# Patient Record
Sex: Male | Born: 2000 | Marital: Single
Health system: Southern US, Community
[De-identification: ages and names within clinical notes are randomized; demographics above are authoritative.]

---
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
## 2021-03-09 IMAGING — CR DG ABDOMEN 2V
3 series · 3 of 3 positions shown · non-contrast
Comparison: None.

CLINICAL DATA: 18-year-old male with cough and right-sided
abdominal pain.

EXAM:
ABDOMEN - 2 VIEW

[abdomen supine (1 of 2)]
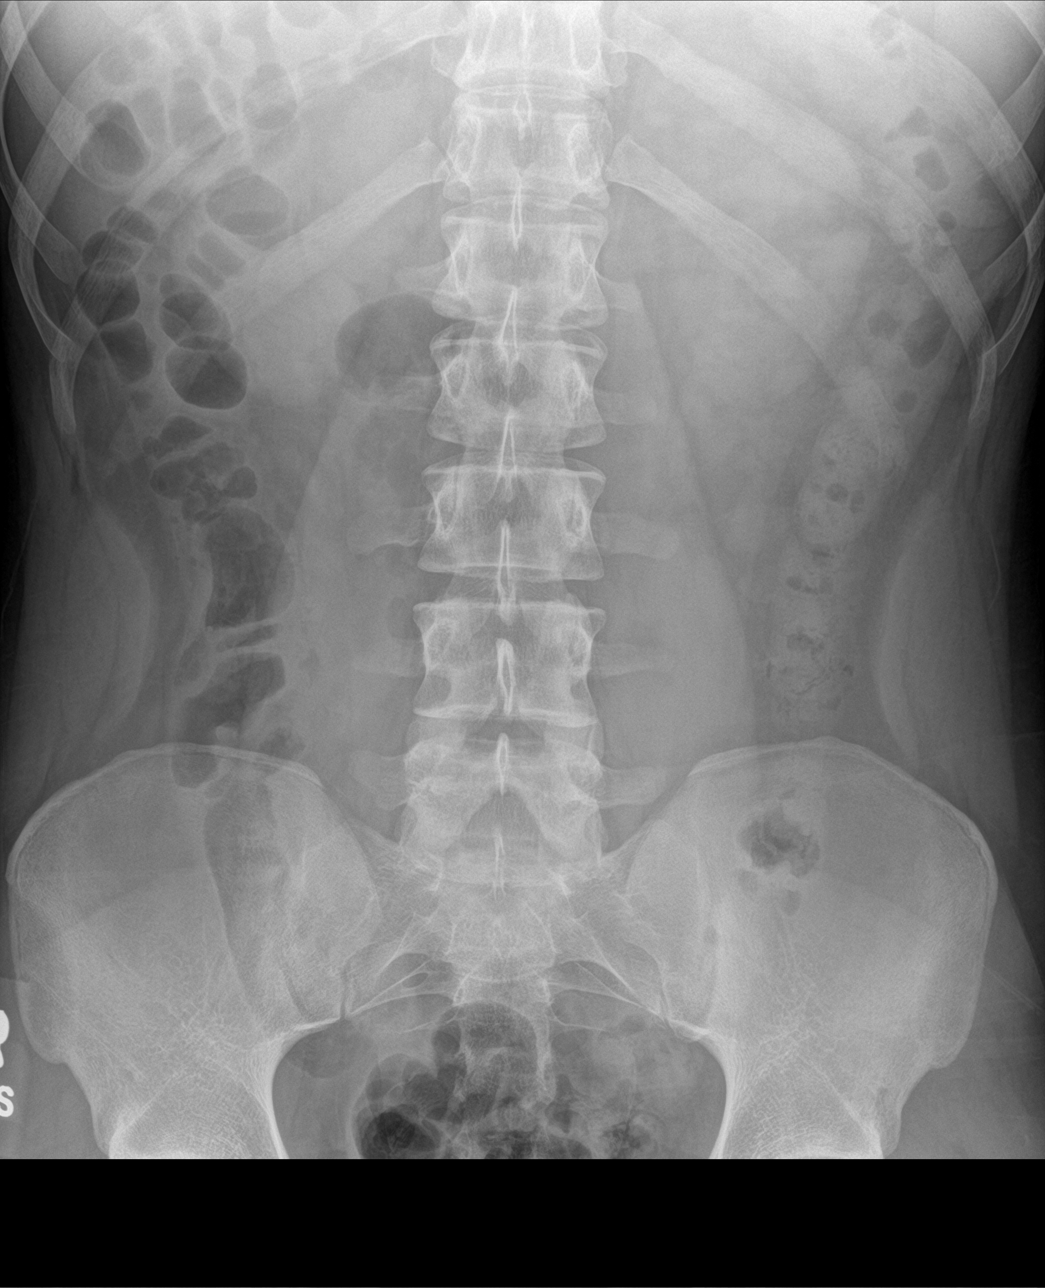

[abdomen supine (2 of 2)]
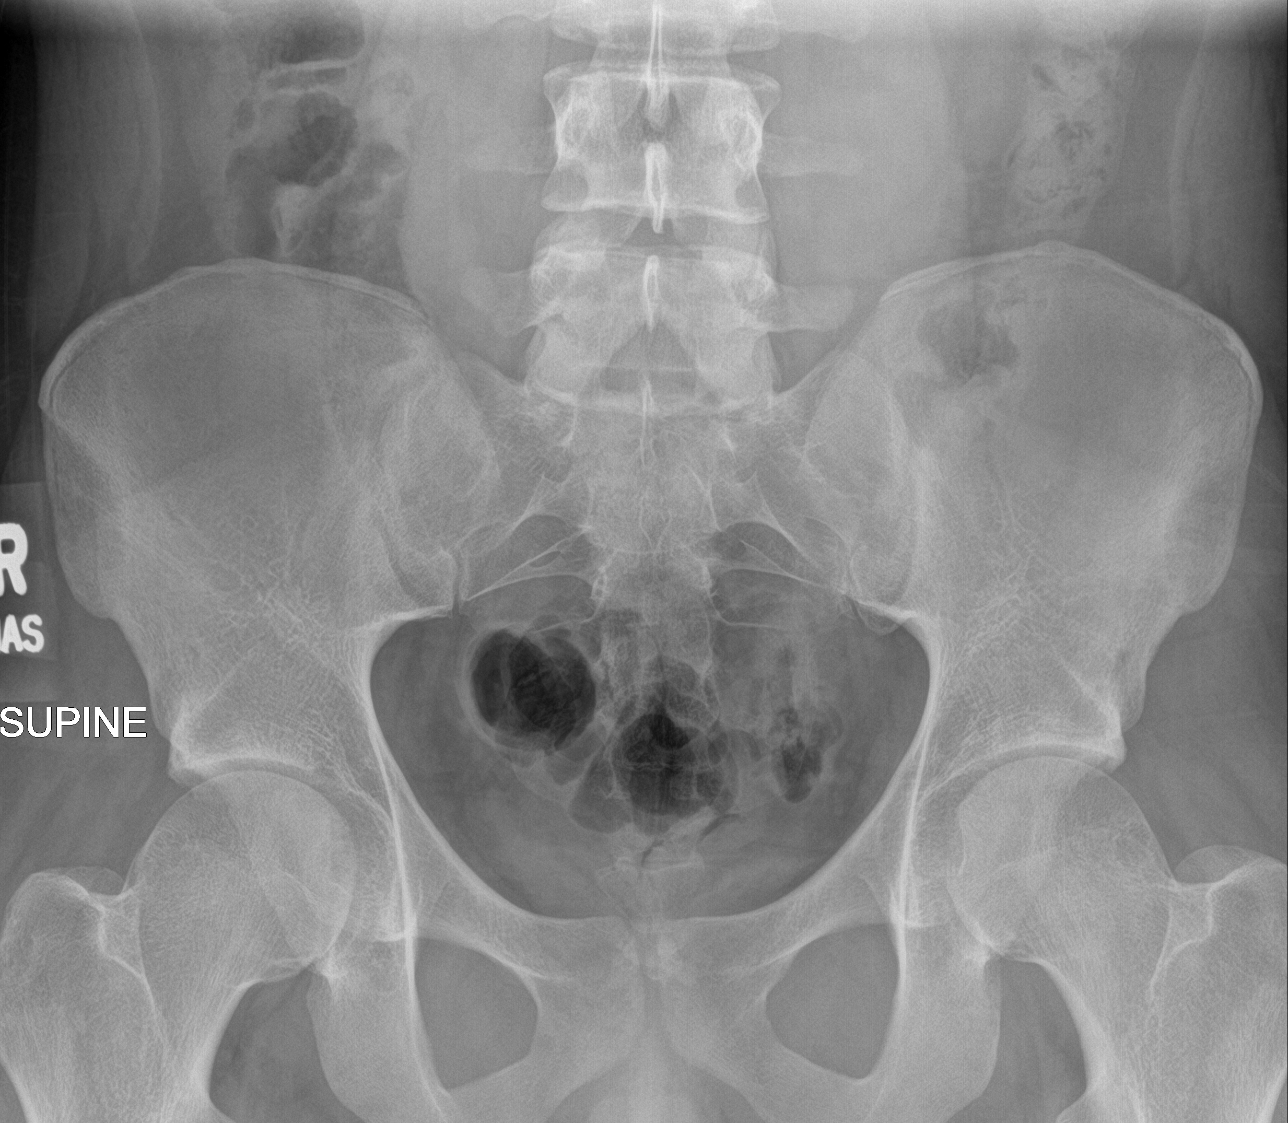

[abdomen erect]
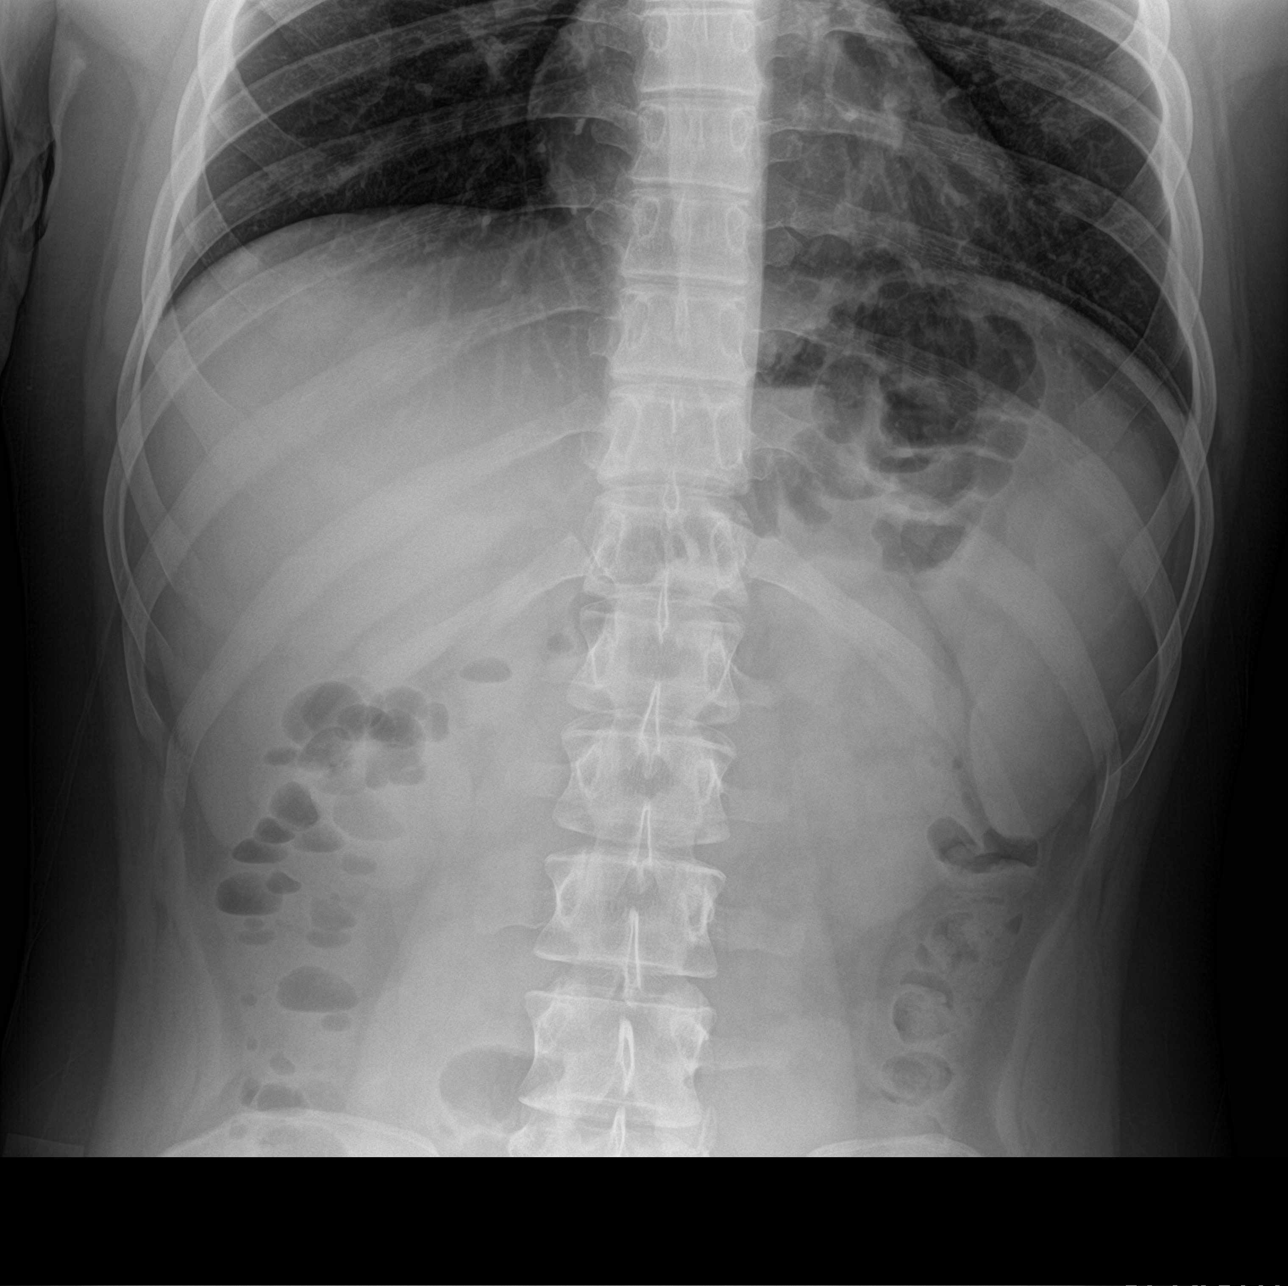

[3 of 3 positions shown; findings below may reference images not displayed]

FINDINGS: The bowel gas pattern is normal. There is no evidence of free air.
No radio-opaque calculi or other significant radiographic
abnormality is seen.
IMPRESSION: Negative.
